# Patient Record
Sex: Female | Born: 1970 | Race: White | Hispanic: No | Marital: Single | Smoking: Current some day smoker
Health system: Southern US, Community
[De-identification: ages and names within clinical notes are randomized; demographics above are authoritative.]

## PROBLEM LIST (undated history)

## (undated) DIAGNOSIS — R112 Nausea with vomiting, unspecified: Secondary | ICD-10-CM

## (undated) DIAGNOSIS — Z9889 Other specified postprocedural states: Secondary | ICD-10-CM

## (undated) DIAGNOSIS — N201 Calculus of ureter: Secondary | ICD-10-CM

## (undated) DIAGNOSIS — R3915 Urgency of urination: Secondary | ICD-10-CM

## (undated) DIAGNOSIS — Z8719 Personal history of other diseases of the digestive system: Secondary | ICD-10-CM

## (undated) DIAGNOSIS — K219 Gastro-esophageal reflux disease without esophagitis: Secondary | ICD-10-CM

## (undated) DIAGNOSIS — R300 Dysuria: Secondary | ICD-10-CM

## (undated) DIAGNOSIS — Z87442 Personal history of urinary calculi: Secondary | ICD-10-CM

## (undated) DIAGNOSIS — R350 Frequency of micturition: Secondary | ICD-10-CM

## (undated) DIAGNOSIS — Z87448 Personal history of other diseases of urinary system: Secondary | ICD-10-CM

## (undated) HISTORY — PX: CYSTOSCOPY WITH URETHRAL DILATATION: SHX5125

---
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
## 1995-04-24 HISTORY — PX: INGUINAL HERNIA REPAIR: SUR1180

## 2000-06-24 ENCOUNTER — Other Ambulatory Visit: Admission: RE | Admit: 2000-06-24 | Discharge: 2000-06-24 | Payer: Self-pay | Admitting: Gynecology

## 2001-12-18 ENCOUNTER — Ambulatory Visit (HOSPITAL_COMMUNITY): Admission: RE | Admit: 2001-12-18 | Discharge: 2001-12-18 | Payer: Self-pay

## 2002-01-29 ENCOUNTER — Ambulatory Visit (HOSPITAL_COMMUNITY): Admission: RE | Admit: 2002-01-29 | Discharge: 2002-01-29 | Payer: Self-pay

## 2002-05-05 ENCOUNTER — Observation Stay (HOSPITAL_COMMUNITY): Admission: AD | Admit: 2002-05-05 | Discharge: 2002-05-06 | Payer: Self-pay

## 2002-06-03 ENCOUNTER — Encounter (HOSPITAL_COMMUNITY): Admission: RE | Admit: 2002-06-03 | Discharge: 2002-07-03 | Payer: Self-pay

## 2002-06-30 ENCOUNTER — Inpatient Hospital Stay (HOSPITAL_COMMUNITY): Admission: AD | Admit: 2002-06-30 | Discharge: 2002-06-30 | Payer: Self-pay

## 2002-07-02 ENCOUNTER — Inpatient Hospital Stay (HOSPITAL_COMMUNITY): Admission: AD | Admit: 2002-07-02 | Discharge: 2002-07-02 | Payer: Self-pay

## 2002-07-04 ENCOUNTER — Inpatient Hospital Stay (HOSPITAL_COMMUNITY): Admission: AD | Admit: 2002-07-04 | Discharge: 2002-07-06 | Payer: Self-pay

## 2004-01-03 ENCOUNTER — Inpatient Hospital Stay (HOSPITAL_COMMUNITY): Admission: AD | Admit: 2004-01-03 | Discharge: 2004-01-04 | Payer: Self-pay | Admitting: Obstetrics and Gynecology

## 2004-01-03 ENCOUNTER — Encounter (INDEPENDENT_AMBULATORY_CARE_PROVIDER_SITE_OTHER): Payer: Self-pay | Admitting: Specialist

## 2004-05-25 ENCOUNTER — Other Ambulatory Visit: Admission: RE | Admit: 2004-05-25 | Discharge: 2004-05-25 | Payer: Self-pay | Admitting: Obstetrics and Gynecology

## 2004-12-05 ENCOUNTER — Encounter (INDEPENDENT_AMBULATORY_CARE_PROVIDER_SITE_OTHER): Payer: Self-pay | Admitting: *Deleted

## 2004-12-05 ENCOUNTER — Inpatient Hospital Stay (HOSPITAL_COMMUNITY): Admission: RE | Admit: 2004-12-05 | Discharge: 2004-12-09 | Payer: Self-pay | Admitting: Obstetrics and Gynecology

## 2006-07-01 ENCOUNTER — Ambulatory Visit (HOSPITAL_COMMUNITY): Admission: RE | Admit: 2006-07-01 | Discharge: 2006-07-01 | Payer: Self-pay | Admitting: Obstetrics and Gynecology

## 2007-09-04 ENCOUNTER — Encounter: Admission: RE | Admit: 2007-09-04 | Discharge: 2007-09-04 | Payer: Self-pay | Admitting: Family Medicine

## 2008-06-29 ENCOUNTER — Encounter: Admission: RE | Admit: 2008-06-29 | Discharge: 2008-06-29 | Payer: Self-pay | Admitting: Family Medicine

## 2010-05-14 ENCOUNTER — Encounter: Payer: Self-pay | Admitting: Obstetrics and Gynecology

## 2010-09-08 NOTE — Discharge Summary (Signed)
Nicole Lawson, [place]               ACCOUNT NO.:  [id number]   MEDICAL RECORD NO.:  [id number]          PATIENT TYPE:  INP   LOCATION:  9133                          [facility]ILITY:  [facility]   PHYSICIAN:  Charles A. Delcambre, MDDATE OF BIRTH:  1970-08-05   DATE OF ADMISSION:  12/05/2004  DATE OF DISCHARGE:  12/09/2004                                 DISCHARGE SUMMARY   DISCHARGE DIAGNOSES:  1.  Previous cesarean section.  2.  Previous vaginal birth after cesarean section.  3.  Undesired fertility.  4.  Significant second trimester bleed with suspicious for significant      marginal separation of placenta, chronic, in my judgment,      contraindicating a trial of labor safety.   PROCEDURE:  1.  Repeat low transverse cesarean section.  2.  Bilateral tubal ligation.   HISTORY AND PHYSICAL:  Dictated on the chart.   DISPOSITION:  The patient is discharged home to follow up in the office in  48 hours.  Discontinue staples.  She was given convalescent instructions to  convalescent home to notify if any temperature greater than 100 degrees, no  lifting greater than 25 pounds for four weeks.  No driving for two weeks.  No bath for two weeks. Shower okay.  Call if temperature greater than 100  degrees, incisional drainage or erythema, increasing pain or bleeding.   DISCHARGE MEDICATIONS:  1.  Prescription for Percocet 5/325 one to two p.o. q.4h. p.r.n. #40.  2.  Hemacyte one p.o. daily #30.   She was discharged with instructions per Dr. Mia Creek in my absence.   LABORATORY DATA:  Postoperative hemoglobin 9.9, hematocrit 29.3.   HOSPITAL COURSE:  The patient was admitted and underwent surgery as noted  above.  She delivered a vigorous female, Apgars 9 and 9, 3500 grams, 51 cm.  She did well postoperatively.  Foley catheter was discontinued postoperative  day #1.  She was given general diet on postoperative day #1 and tolerated  the diet.  Pain was controlled with p.o. medications.  She,  therefore,  continued to do well.  On postoperative day #2, she was given  Diflucan for suspected thrush on the nipples with physical exam confirming.  She is RH negative.  The baby's test was pending at the time of discharge  and she would be given RhoGAM pending the outcome prior to discharge.  She  was discharged home with followup as noted above.      Charles A. Sydnee Cabal, MD  Electronically Signed     CAD/MEDQ  D:  01/06/2005  T:  01/07/2005  Job:  [id number]

## 2010-09-08 NOTE — Op Note (Signed)
NAMEALDENA, WORM               ACCOUNT NO.:  [id number]   MEDICAL RECORD NO.:  [id number]          PATIENT TYPE:  INP   LOCATION:  NA                            [facility]  [facility]   PHYSICIAN:  Charles A. Delcambre, MDDATE OF BIRTH:  Jul 29, 1970   DATE OF PROCEDURE:  12/05/2004  DATE OF DISCHARGE:                                 OPERATIVE REPORT   PREOPERATIVE DIAGNOSES:  1.  Intrauterine pregnancy at 39 weeks.  2.  Previous cesarean section.  3.  Second trimester bleeding.  4.  Vaginal birth after cesarean delivery x2.  5.  Undesired fertility.  6.  For repeat cesarean section with bilateral tubal ligation.   POSTOPERATIVE DIAGNOSES:  1.  Intrauterine pregnancy at 39 weeks.  2.  Previous cesarean section.  3.  Second trimester bleeding.  4.  Vaginal birth after cesarean delivery x2.  5.  Undesired fertility.  6.  For repeat cesarean section with bilateral tubal ligation.   PROCEDURES:  1.  Repeat low transverse cesarean section.  2.  Modified Pomeroy bilateral tubal ligation.   SURGEON:  Charles A. Sydnee Cabal, M.D.   ASSISTANT:  Rudy Jew. Ashley Royalty, M.D.   COMPLICATIONS:  None.   ESTIMATED BLOOD LOSS:  500 mL.   SPECIMENS:  Placenta to pathology.   OPERATIVE FINDINGS:  A vigorous female, Apgars 9 and 9.  Cord and arterial  blood gas pending.  Instrument, sponge and needle count correct x2.  Further  operative findings:  The tubes and uterus and ovaries were normal.   DESCRIPTION OF PROCEDURE:  The patient was taken to the operating room and  placed in the supine position.  Spinal had been dosed and was adequate.  Sterile prep and drape was undertaken.  A Pfannenstiel incision was made and  carried down to the fascia.  The fascia was incised and released superiorly  and inferiorly.  The rectus muscles were sharply dissected in the midline.  The peritoneum was entered with Metzenbaum scissors without damage to the  bowel, bladder and vascular structures.  Traction was  used to extend the  incision.  A bladder blade was placed.  The vesicouterine peritoneum was  incised with Metzenbaum scissors.  Blunt dissection was used to develop the  bladder flap, and a bladder flap was placed.  A lower uterine segment  transverse incision was made to amniotomy.  A hand was inserted.  The  occiput was lifted to the uterine incision site.  Fundal pressure was placed  by the operator's assistant and the infant was delivered without difficulty.  A nuchal cord x1 was reduced easily.  Cord was clamped.  The infant was  shown to the mother and father and handed off to the neonatologist in  attendance.  Cord gas was taken and cord blood sample was taken.  Placenta  was massaged free and manually expressed.  Internal surface of the uterus  was wiped with a moistened cloth.  The uterus was externalized for repair.  The uterus was closed with a single layer of #1 chromic running locking and  several figure-of-eight sutures of #1 chromic and  2-0 Vicryl were used to  achieve hemostasis.  Hemostasis was excellent.  Bladder flap hemostasis was  excellent.  A tubal ligation was carried out.  The middle segment of the  fallopian tube was ligated doubly and with 0 plain gut on either side  excised, hemostasis was excellent.  The segment was transected, and the  tubal lumen was clearly transected.  The uterus was then internalized and  the pedicles were visualized.  The incision on the uterus was closed, and  this had excellent hemostasis.  Irrigation was carried out.  Subfascial  hemostasis was excellent.  The fascia was closed with #1 Vicryl running  nonlocking suture.  Subcutaneous hemostasis was excellent.  Irrigation was  carried out.  Sterile skin staples were used to close the skin.  A sterile  dressing was applied.  The patient was taken to the recovery room with  physician in attendance.      Charles A. Sydnee Cabal, MD  Electronically Signed     CAD/MEDQ  D:  12/05/2004   T:  12/05/2004  Job:  [id number]
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
## 2012-02-06 ENCOUNTER — Other Ambulatory Visit: Payer: Self-pay | Admitting: Family Medicine

## 2012-02-06 DIAGNOSIS — Z1231 Encounter for screening mammogram for malignant neoplasm of breast: Secondary | ICD-10-CM

## 2012-03-10 ENCOUNTER — Ambulatory Visit: Payer: Self-pay

## 2015-04-17 ENCOUNTER — Encounter (HOSPITAL_COMMUNITY): Payer: Self-pay | Admitting: Emergency Medicine

## 2015-04-17 ENCOUNTER — Emergency Department (HOSPITAL_COMMUNITY): Payer: BLUE CROSS/BLUE SHIELD

## 2015-04-17 ENCOUNTER — Emergency Department (HOSPITAL_COMMUNITY)
Admission: EM | Admit: 2015-04-17 | Discharge: 2015-04-17 | Disposition: A | Discharge status: Home / Self Care | Payer: BLUE CROSS/BLUE SHIELD | Attending: Emergency Medicine | Admitting: Emergency Medicine

## 2015-04-17 DIAGNOSIS — Z9889 Other specified postprocedural states: Secondary | ICD-10-CM | POA: Diagnosis not present

## 2015-04-17 DIAGNOSIS — N23 Unspecified renal colic: Secondary | ICD-10-CM

## 2015-04-17 DIAGNOSIS — R109 Unspecified abdominal pain: Secondary | ICD-10-CM | POA: Diagnosis present

## 2015-04-17 DIAGNOSIS — Z87442 Personal history of urinary calculi: Secondary | ICD-10-CM | POA: Insufficient documentation

## 2015-04-17 DIAGNOSIS — Z3202 Encounter for pregnancy test, result negative: Secondary | ICD-10-CM | POA: Diagnosis not present

## 2015-04-17 LAB — URINALYSIS, ROUTINE W REFLEX MICROSCOPIC
GLUCOSE, UA: NEGATIVE mg/dL
KETONES UR: 15 mg/dL — AB
Nitrite: NEGATIVE
PROTEIN: 100 mg/dL — AB
Specific Gravity, Urine: 1.022 (ref 1.005–1.030)
pH: 6.5 (ref 5.0–8.0)

## 2015-04-17 LAB — I-STAT CHEM 8, ED
BUN: 9 mg/dL (ref 6–20)
CALCIUM ION: 1.12 mmol/L (ref 1.12–1.23)
Chloride: 108 mmol/L (ref 101–111)
Creatinine, Ser: 0.8 mg/dL (ref 0.44–1.00)
GLUCOSE: 111 mg/dL — AB (ref 65–99)
HCT: 41 % (ref 36.0–46.0)
HEMOGLOBIN: 13.9 g/dL (ref 12.0–15.0)
Potassium: 4 mmol/L (ref 3.5–5.1)
Sodium: 145 mmol/L (ref 135–145)
TCO2: 23 mmol/L (ref 0–100)

## 2015-04-17 LAB — URINE MICROSCOPIC-ADD ON

## 2015-04-17 LAB — I-STAT BETA HCG BLOOD, ED (MC, WL, AP ONLY)

## 2015-04-17 MED ORDER — HYDROMORPHONE HCL 1 MG/ML IJ SOLN
1.0000 mg | Freq: Once | INTRAMUSCULAR | Status: AC
  Administered 2015-04-17: 1 mg via INTRAVENOUS
  Filled 2015-04-17: qty 1

## 2015-04-17 MED ORDER — ONDANSETRON HCL 4 MG/2ML IJ SOLN
4.0000 mg | Freq: Once | INTRAMUSCULAR | Status: AC
Start: 2015-04-17 — End: 2015-04-17
  Administered 2015-04-17: 4 mg via INTRAVENOUS
  Filled 2015-04-17: qty 2

## 2015-04-17 MED ORDER — HYDROCODONE-ACETAMINOPHEN 5-325 MG PO TABS
1.0000 | ORAL_TABLET | ORAL | Status: AC | PRN
Start: 2015-04-17 — End: ?

## 2015-04-17 MED ORDER — KETOROLAC TROMETHAMINE 30 MG/ML IJ SOLN
30.0000 mg | Freq: Once | INTRAMUSCULAR | Status: AC
  Administered 2015-04-17: 30 mg via INTRAVENOUS
  Filled 2015-04-17: qty 1

## 2015-04-17 MED ORDER — ONDANSETRON HCL 8 MG PO TABS
8.0000 mg | ORAL_TABLET | Freq: Three times a day (TID) | ORAL | Status: AC | PRN
Start: 2015-04-17 — End: ?

## 2015-04-17 MED ORDER — METOCLOPRAMIDE HCL 5 MG/ML IJ SOLN
10.0000 mg | Freq: Once | INTRAMUSCULAR | Status: AC
  Administered 2015-04-17: 10 mg via INTRAVENOUS
  Filled 2015-04-17: qty 2

## 2015-04-17 NOTE — ED Notes (Signed)
Patient states she started having left sided flank plain radiating to the groin. Patient states has HX of kidney stones last one a few years ago. Patient states pain fells the same. Patient states was able to past pervious on her own. Patient rates pain 8/10

## 2015-04-17 NOTE — ED Notes (Signed)
Patient ambulated to restroom to give urine sample patient unable to urinate. Patient advised in & Out patient refused.

## 2015-04-17 NOTE — ED Provider Notes (Addendum)
CSN: [id number]     Arrival date & time 04/17/15  0726 History   First MD Initiated Contact with Patient 04/17/15 [phone]     Chief Complaint  Patient presents with  . Flank Pain     (Consider location/radiation/quality/duration/timing/severity/associated sxs/prior Treatment) HPI Planes of left-sided groin pain radiating to left flank onto 3 PM yesterday accompanied by vomiting every 30 minutes and nausea. She treated herself by drinking water. Nothing makes pain better or worse pain feels like kidney stone she's had in the past. Associated symptoms including hematuria. She denies fever. No other associated symptoms. Pain is moderate to severe at present 7 on a scale of 1-10. Last menstrual period 04/10/2015 No past medical history on file. past medical history kidney stones No past surgical history on file. surgical history unknown abdominal surgery as a teenager No family history on file. Social History  Substance Use Topics  . Smoking status: Not on file  . Smokeless tobacco: Not on file  . Alcohol Use: Not on file   positive smoker occasional alcohol no illicit drug use OB History    No data available     Review of Systems  Constitutional: Negative.   HENT: Negative.   Respiratory: Negative.   Cardiovascular: Negative.   Gastrointestinal: Positive for nausea and vomiting.  Genitourinary: Positive for flank pain.  Musculoskeletal: Negative.   Skin: Negative.   Neurological: Negative.   Psychiatric/Behavioral: Negative.   All other systems reviewed and are negative.     Allergies  Review of patient's allergies indicates not on file.  Home Medications   Prior to Admission medications   Not on File   BP 102/68 mmHg  Pulse 70  Temp(Src) 98.3 F (36.8 C) (Oral)  Resp 16  SpO2 99% Physical Exam  Constitutional: She appears well-developed and well-nourished. She appears distressed.  Appears uncomfortable  HENT:  Head: Normocephalic and atraumatic.  Eyes:  Conjunctivae are normal. Pupils are equal, round, and reactive to light.  Neck: Neck supple. No tracheal deviation present. No thyromegaly present.  Cardiovascular: Normal rate and regular rhythm.   No murmur heard. Pulmonary/Chest: Effort normal and breath sounds normal.  Abdominal: Soft. Bowel sounds are normal. She exhibits no distension. There is no tenderness.  Genitourinary:  No flank tenderness  Musculoskeletal: Normal range of motion. She exhibits no edema or tenderness.  Neurological: She is alert. Coordination normal.  Skin: Skin is warm and dry. No rash noted.  Psychiatric: She has a normal mood and affect.  Nursing note and vitals reviewed.   ED Course  Procedures (including critical care time) Labs Review Labs Reviewed - No data to display  Imaging Review No results found. I have personally reviewed and evaluated these images and lab results as part of my medical decision-making.   EKG Interpretation None     3:10 PM patient asymptomatic, pain-free, feels ready to go home after treatment with intravenous opioids, antiemetics and intravenous Toradol Results for orders placed or performed during the hospital encounter of 04/17/15  Urinalysis, Routine w reflex microscopic (not [facility])  Result Value Ref Range   Color, Urine RED (A) YELLOW   APPearance CLOUDY (A) CLEAR   Specific Gravity, Urine 1.022 1.005 - 1.030   pH 6.5 5.0 - 8.0   Glucose, UA NEGATIVE NEGATIVE mg/dL   Hgb urine dipstick LARGE (A) NEGATIVE   Bilirubin Urine SMALL (A) NEGATIVE   Ketones, ur 15 (A) NEGATIVE mg/dL   Protein, ur 295100 (A) NEGATIVE mg/dL   Nitrite NEGATIVE  NEGATIVE   Leukocytes, UA SMALL (A) NEGATIVE  Urine microscopic-add on  Result Value Ref Range   Squamous Epithelial / LPF 0-5 (A) NONE SEEN   WBC, UA 0-5 0 - 5 WBC/hpf   RBC / HPF TOO NUMEROUS TO COUNT 0 - 5 RBC/hpf   Bacteria, UA RARE (A) NONE SEEN  I-stat chem 8, ed  Result Value Ref Range   Sodium 145 135 - 145 mmol/L    Potassium 4.0 3.5 - 5.1 mmol/L   Chloride 108 101 - 111 mmol/L   BUN 9 6 - 20 mg/dL   Creatinine, Ser 0.98 0.44 - 1.00 mg/dL   Glucose, Bld 119 (H) 65 - 99 mg/dL   Calcium, Ion 1.47 8.29 - 1.23 mmol/L   TCO2 23 0 - 100 mmol/L   Hemoglobin 13.9 12.0 - 15.0 g/dL   HCT 56.2 13.0 - 86.5 %  I-Stat Beta hCG blood, ED ([facility], [facility], [facility] only)  Result Value Ref Range   I-stat hCG, quantitative <5.0 <5 mIU/mL   Comment 3           Ct Abdomen Pelvis Wo Contrast  04/17/2015  CLINICAL DATA:  Left flank pain since yesterday EXAM: CT ABDOMEN AND PELVIS WITHOUT CONTRAST TECHNIQUE: Multidetector CT imaging of the abdomen and pelvis was performed following the standard protocol without IV contrast. COMPARISON:  June 29, 2008 FINDINGS: There is left hydroureteronephrosis due to obstruction by a 5.4 mm stone in the distal left ureter. There is a 3 mm nonobstructing stone in midpole left kidney. The right kidney is normal without hydronephrosis or nephrolithiasis. The liver, spleen, pancreas, gallbladder, adrenal glands are normal. The aorta is normal. There is no abdominal lymphadenopathy. There is no small bowel obstruction or diverticulitis. The appendix is normal. The bladder is normal. The uterus is normal. There are small cysts in normal size bilateral ovaries. The lung bases are clear. No acute abnormality is identified in the bones. IMPRESSION: Left hydroureteronephrosis due to obstruction by 5.4 mm stone in the distal left ureter. Electronically Signed   By: Sherian Rein M.D.   On: 04/17/2015 11:20    MDM  Plan ibuprofen for pain. Prescriptions for Norco, zofran Referral [facility] Diagnosis ureteral colic Final diagnoses:  None        Doug Sou, MD 04/17/15 1515  Doug Sou, MD 04/17/15 1517

## 2015-04-17 NOTE — Discharge Instructions (Signed)
Kidney Stones Take Advil 4 tablets (800milligrams) 3 times daily for moderate or mild pain or take the pain medicine prescribed for bad pain. You've also been prescribed medicine for nausea. Call [facility] tomorrow to schedule the next available appointment. If pain or nausea is not well controlled with the medication prescribed, [facility]. Kidney stones (urolithiasis) are solid masses that form inside your kidneys. The intense pain is caused by the stone moving through the kidney, ureter, bladder, and urethra (urinary tract). When the stone moves, the ureter starts to spasm around the stone. The stone is usually passed in your pee (urine).  HOME CARE  Drink enough fluids to keep your pee clear or pale yellow. This helps to get the stone out.  Take a 24-hour pee (urine) sample as told by your doctor. You may need to take another sample every 6-12 months.  Strain all pee through the provided strainer. Do not pee without peeing through the strainer, not even once. If you pee the stone out, catch it in the strainer. The stone may be as small as a grain of salt. Take this to your doctor. This will help your doctor figure out what you can do to try to prevent more kidney stones.  Only take medicine as told by your doctor.  Make changes to your daily diet as told by your doctor. You may be told to:  Limit how much salt you eat.  Eat 5 or more servings of fruits and vegetables each day.  Limit how much meat, poultry, fish, and eggs you eat.  Keep all follow-up visits as told by your doctor. This is important.  Get follow-up X-rays as told by your doctor. GET HELP IF: You have pain that gets worse even if you have been taking pain medicine. GET HELP RIGHT AWAY IF:   Your pain does not get better with medicine.  You have a fever or shaking chills.  Your pain increases and gets worse over 18 hours.  You have new belly (abdominal)  pain.  You feel faint or pass out.  You are unable to pee.   This information is not intended to replace advice given to you by your health care provider. Make sure you discuss any questions you have with your health care provider.   Document Released: 09/26/2007 Document Revised: 12/29/2014 Document Reviewed: 09/10/2012 Elsevier Interactive Patient Education [employer] Inc.

## 2015-04-28 ENCOUNTER — Encounter (HOSPITAL_BASED_OUTPATIENT_CLINIC_OR_DEPARTMENT_OTHER): Payer: Self-pay | Admitting: *Deleted

## 2015-04-28 ENCOUNTER — Other Ambulatory Visit: Payer: Self-pay | Admitting: Urology

## 2015-04-29 ENCOUNTER — Encounter (HOSPITAL_BASED_OUTPATIENT_CLINIC_OR_DEPARTMENT_OTHER): Payer: Self-pay | Admitting: *Deleted

## 2015-04-29 NOTE — Progress Notes (Signed)
NPO AFTER MN.  ARRIVE AT 0745.  NEEDS HG.  MAY TAKE PAIN/ NAUSEA RX'S IF NEEDED TAKE AM DOS W/ SIPS OF WATER.

## 2015-05-04 ENCOUNTER — Ambulatory Visit (HOSPITAL_BASED_OUTPATIENT_CLINIC_OR_DEPARTMENT_OTHER): Payer: BLUE CROSS/BLUE SHIELD | Admitting: Anesthesiology

## 2015-05-04 ENCOUNTER — Encounter (HOSPITAL_BASED_OUTPATIENT_CLINIC_OR_DEPARTMENT_OTHER)
Admission: RE | Disposition: A | Discharge status: Home / Self Care | Payer: Self-pay | Source: Ambulatory Visit | Attending: Urology

## 2015-05-04 ENCOUNTER — Ambulatory Visit (HOSPITAL_BASED_OUTPATIENT_CLINIC_OR_DEPARTMENT_OTHER)
Admission: RE | Admit: 2015-05-04 | Discharge: 2015-05-04 | Disposition: A | Discharge status: Home / Self Care | Payer: BLUE CROSS/BLUE SHIELD | Source: Ambulatory Visit | Attending: Urology | Admitting: Urology

## 2015-05-04 ENCOUNTER — Encounter (HOSPITAL_BASED_OUTPATIENT_CLINIC_OR_DEPARTMENT_OTHER): Payer: Self-pay | Admitting: *Deleted

## 2015-05-04 DIAGNOSIS — N132 Hydronephrosis with renal and ureteral calculous obstruction: Secondary | ICD-10-CM | POA: Insufficient documentation

## 2015-05-04 DIAGNOSIS — F17200 Nicotine dependence, unspecified, uncomplicated: Secondary | ICD-10-CM | POA: Diagnosis not present

## 2015-05-04 DIAGNOSIS — K5900 Constipation, unspecified: Secondary | ICD-10-CM | POA: Insufficient documentation

## 2015-05-04 DIAGNOSIS — K219 Gastro-esophageal reflux disease without esophagitis: Secondary | ICD-10-CM | POA: Diagnosis not present

## 2015-05-04 DIAGNOSIS — N201 Calculus of ureter: Secondary | ICD-10-CM

## 2015-05-04 HISTORY — DX: Other specified postprocedural states: Z98.890

## 2015-05-04 HISTORY — DX: Personal history of other diseases of urinary system: Z87.448

## 2015-05-04 HISTORY — DX: Dysuria: R30.0

## 2015-05-04 HISTORY — DX: Frequency of micturition: R35.0

## 2015-05-04 HISTORY — PX: STONE EXTRACTION WITH BASKET: SHX5318

## 2015-05-04 HISTORY — DX: Nausea with vomiting, unspecified: R11.2

## 2015-05-04 HISTORY — DX: Gastro-esophageal reflux disease without esophagitis: K21.9

## 2015-05-04 HISTORY — PX: CYSTOSCOPY/URETEROSCOPY/HOLMIUM LASER: SHX6545

## 2015-05-04 HISTORY — PX: HOLMIUM LASER APPLICATION: SHX5852

## 2015-05-04 HISTORY — PX: CYSTOSCOPY W/ RETROGRADES: SHX1426

## 2015-05-04 HISTORY — DX: Urgency of urination: R39.15

## 2015-05-04 HISTORY — DX: Personal history of other diseases of the digestive system: Z87.19

## 2015-05-04 HISTORY — DX: Calculus of ureter: N20.1

## 2015-05-04 HISTORY — DX: Personal history of urinary calculi: Z87.442

## 2015-05-04 LAB — POCT HEMOGLOBIN-HEMACUE: HEMOGLOBIN: 12.4 g/dL (ref 12.0–15.0)

## 2015-05-04 SURGERY — HOLMIUM LASER APPLICATION
Anesthesia: General | Site: Ureter | Laterality: Left

## 2015-05-04 MED ORDER — LIDOCAINE HCL (CARDIAC) 20 MG/ML IV SOLN
INTRAVENOUS | Status: AC
  Filled 2015-05-04: qty 5

## 2015-05-04 MED ORDER — DEXAMETHASONE SODIUM PHOSPHATE 4 MG/ML IJ SOLN
INTRAMUSCULAR | Status: DC | PRN
  Administered 2015-05-04: 10 mg via INTRAVENOUS

## 2015-05-04 MED ORDER — LIDOCAINE HCL (CARDIAC) 20 MG/ML IV SOLN
INTRAVENOUS | Status: DC | PRN
  Administered 2015-05-04: 60 mg via INTRAVENOUS

## 2015-05-04 MED ORDER — PROPOFOL 10 MG/ML IV BOLUS
INTRAVENOUS | Status: AC
  Filled 2015-05-04: qty 20

## 2015-05-04 MED ORDER — KETOROLAC TROMETHAMINE 30 MG/ML IJ SOLN
INTRAMUSCULAR | Status: DC | PRN
  Administered 2015-05-04: 30 mg via INTRAVENOUS

## 2015-05-04 MED ORDER — MIDAZOLAM HCL 2 MG/2ML IJ SOLN
INTRAMUSCULAR | Status: AC
  Filled 2015-05-04: qty 2

## 2015-05-04 MED ORDER — FENTANYL CITRATE (PF) 100 MCG/2ML IJ SOLN
25.0000 ug | INTRAMUSCULAR | Status: DC | PRN
  Filled 2015-05-04: qty 1

## 2015-05-04 MED ORDER — LACTATED RINGERS IV SOLN
INTRAVENOUS | Status: DC
  Administered 2015-05-04: 09:00:00 via INTRAVENOUS
  Filled 2015-05-04: qty 1000

## 2015-05-04 MED ORDER — FENTANYL CITRATE (PF) 100 MCG/2ML IJ SOLN
INTRAMUSCULAR | Status: AC
  Filled 2015-05-04: qty 2

## 2015-05-04 MED ORDER — SCOPOLAMINE 1 MG/3DAYS TD PT72
1.0000 | MEDICATED_PATCH | TRANSDERMAL | Status: DC
  Administered 2015-05-04: 1.5 mg via TRANSDERMAL
  Filled 2015-05-04: qty 1

## 2015-05-04 MED ORDER — ONDANSETRON HCL 4 MG/2ML IJ SOLN
INTRAMUSCULAR | Status: AC
  Filled 2015-05-04: qty 2

## 2015-05-04 MED ORDER — EPHEDRINE SULFATE 50 MG/ML IJ SOLN
INTRAMUSCULAR | Status: AC
  Filled 2015-05-04: qty 1

## 2015-05-04 MED ORDER — SUCCINYLCHOLINE CHLORIDE 20 MG/ML IJ SOLN
INTRAMUSCULAR | Status: DC | PRN
  Administered 2015-05-04: 100 mg via INTRAVENOUS

## 2015-05-04 MED ORDER — FENTANYL CITRATE (PF) 100 MCG/2ML IJ SOLN
INTRAMUSCULAR | Status: DC | PRN
  Administered 2015-05-04 (×2): 50 ug via INTRAVENOUS

## 2015-05-04 MED ORDER — SCOPOLAMINE 1 MG/3DAYS TD PT72
MEDICATED_PATCH | TRANSDERMAL | Status: AC
  Filled 2015-05-04: qty 1

## 2015-05-04 MED ORDER — SODIUM CHLORIDE 0.9 % IR SOLN
Status: DC | PRN
  Administered 2015-05-04: 2000 mL

## 2015-05-04 MED ORDER — ONDANSETRON HCL 4 MG/2ML IJ SOLN
INTRAMUSCULAR | Status: DC | PRN
  Administered 2015-05-04: 4 mg via INTRAVENOUS

## 2015-05-04 MED ORDER — PROPOFOL 10 MG/ML IV BOLUS
INTRAVENOUS | Status: DC | PRN
  Administered 2015-05-04: 50 mg via INTRAVENOUS
  Administered 2015-05-04: 200 mg via INTRAVENOUS

## 2015-05-04 MED ORDER — DEXAMETHASONE SODIUM PHOSPHATE 10 MG/ML IJ SOLN
INTRAMUSCULAR | Status: AC
  Filled 2015-05-04: qty 1

## 2015-05-04 MED ORDER — PROMETHAZINE HCL 25 MG/ML IJ SOLN
6.2500 mg | INTRAMUSCULAR | Status: DC | PRN
  Filled 2015-05-04: qty 1

## 2015-05-04 MED ORDER — PROPOFOL 500 MG/50ML IV EMUL: INTRAVENOUS | Status: DC | PRN

## 2015-05-04 MED ORDER — LIDOCAINE HCL 2 % EX GEL
CUTANEOUS | Status: DC | PRN
  Administered 2015-05-04: 1

## 2015-05-04 MED ORDER — KETOROLAC TROMETHAMINE 30 MG/ML IJ SOLN
INTRAMUSCULAR | Status: AC
  Filled 2015-05-04: qty 1

## 2015-05-04 MED ORDER — HYDROCODONE-ACETAMINOPHEN 5-325 MG PO TABS: 1.0000 | ORAL_TABLET | Freq: Four times a day (QID) | ORAL | Status: AC | PRN

## 2015-05-04 MED ORDER — PROPOFOL 500 MG/50ML IV EMUL
INTRAVENOUS | Status: DC | PRN
  Administered 2015-05-04: 150 ug/kg/min via INTRAVENOUS

## 2015-05-04 MED ORDER — CIPROFLOXACIN IN D5W 400 MG/200ML IV SOLN
400.0000 mg | INTRAVENOUS | Status: AC
  Administered 2015-05-04: 400 mg via INTRAVENOUS
  Filled 2015-05-04: qty 200

## 2015-05-04 MED ORDER — LIDOCAINE HCL 4 % EX SOLN
CUTANEOUS | Status: DC | PRN
  Administered 2015-05-04: 2 mL via TOPICAL

## 2015-05-04 MED ORDER — MIDAZOLAM HCL 5 MG/5ML IJ SOLN
INTRAMUSCULAR | Status: DC | PRN
  Administered 2015-05-04: 2 mg via INTRAVENOUS

## 2015-05-04 MED ORDER — EPHEDRINE SULFATE 50 MG/ML IJ SOLN
INTRAMUSCULAR | Status: DC | PRN
  Administered 2015-05-04: 15 mg via INTRAVENOUS

## 2015-05-04 MED ORDER — IOHEXOL 350 MG/ML SOLN
INTRAVENOUS | Status: DC | PRN
  Administered 2015-05-04: 3 mL

## 2015-05-04 MED ORDER — PROPOFOL 500 MG/50ML IV EMUL
INTRAVENOUS | Status: AC
  Filled 2015-05-04: qty 50

## 2015-05-04 MED ORDER — CIPROFLOXACIN IN D5W 400 MG/200ML IV SOLN
INTRAVENOUS | Status: AC
  Filled 2015-05-04: qty 200

## 2015-05-04 SURGICAL SUPPLY — 44 items
ADAPTER CATH URET PLST 4-6FR (CATHETERS) IMPLANT
APL SKNCLS STERI-STRIP NONHPOA (GAUZE/BANDAGES/DRESSINGS)
BAG DRAIN URO-CYSTO SKYTR STRL (DRAIN) ×5 IMPLANT
BASKET LASER NITINOL 1.9FR (BASKET) IMPLANT
BASKET STNLS GEMINI 4WIRE 3FR (BASKET) IMPLANT
BASKET ZERO TIP NITINOL 2.4FR (BASKET) ×5 IMPLANT
BENZOIN TINCTURE PRP APPL 2/3 (GAUZE/BANDAGES/DRESSINGS) IMPLANT
BSKT STON RTRVL 120 1.9FR (BASKET)
BSKT STON RTRVL GEM 120X11 3FR (BASKET)
CANISTER SUCT LVC 12 LTR MEDI- (MISCELLANEOUS) IMPLANT
CATH INTERMIT  6FR 70CM (CATHETERS) ×5 IMPLANT
CATH URET 5FR 28IN CONE TIP (BALLOONS)
CATH URET 5FR 28IN OPEN ENDED (CATHETERS) IMPLANT
CATH URET 5FR 70CM CONE TIP (BALLOONS) IMPLANT
CLOTH BEACON ORANGE TIMEOUT ST (SAFETY) ×5 IMPLANT
DRSG TEGADERM 2-3/8X2-3/4 SM (GAUZE/BANDAGES/DRESSINGS) IMPLANT
FIBER LASER FLEXIVA 1000 (UROLOGICAL SUPPLIES) IMPLANT
FIBER LASER FLEXIVA 365 (UROLOGICAL SUPPLIES) ×5 IMPLANT
FIBER LASER FLEXIVA 550 (UROLOGICAL SUPPLIES) IMPLANT
FIBER LASER TRAC TIP (UROLOGICAL SUPPLIES) IMPLANT
GLOVE BIO SURGEON STRL SZ 6.5 (GLOVE) ×4 IMPLANT
GLOVE BIO SURGEON STRL SZ7.5 (GLOVE) ×5 IMPLANT
GLOVE BIO SURGEONS STRL SZ 6.5 (GLOVE) ×1
GLOVE BIOGEL PI IND STRL 6.5 (GLOVE) ×6 IMPLANT
GLOVE BIOGEL PI INDICATOR 6.5 (GLOVE) ×4
GOWN STRL REUS W/ TWL XL LVL3 (GOWN DISPOSABLE) ×3 IMPLANT
GOWN STRL REUS W/TWL XL LVL3 (GOWN DISPOSABLE) ×2
GUIDEWIRE 0.038 PTFE COATED (WIRE) IMPLANT
GUIDEWIRE ANG ZIPWIRE 038X150 (WIRE) IMPLANT
GUIDEWIRE STR DUAL SENSOR (WIRE) ×5 IMPLANT
IV NS 1000ML (IV SOLUTION) ×6
IV NS 1000ML BAXH (IV SOLUTION) ×6 IMPLANT
IV NS IRRIG 3000ML ARTHROMATIC (IV SOLUTION) IMPLANT
KIT BALLIN UROMAX 15FX10 (LABEL) IMPLANT
KIT BALLN UROMAX 15FX4 (MISCELLANEOUS) IMPLANT
KIT BALLN UROMAX 26 75X4 (MISCELLANEOUS)
KIT ROOM TURNOVER WOR (KITS) ×5 IMPLANT
MANIFOLD NEPTUNE II (INSTRUMENTS) ×5 IMPLANT
NS IRRIG 500ML POUR BTL (IV SOLUTION) IMPLANT
PACK CYSTO (CUSTOM PROCEDURE TRAY) ×5 IMPLANT
SET HIGH PRES BAL DIL (LABEL)
SHEATH ACCESS URETERAL 38CM (SHEATH) IMPLANT
TUBE CONNECTING 12'X1/4 (SUCTIONS)
TUBE CONNECTING 12X1/4 (SUCTIONS) IMPLANT

## 2015-05-04 NOTE — Anesthesia Postprocedure Evaluation (Signed)
Anesthesia Post Note  Patient: Emmagrace Call  Procedure(s) Performed: Procedure(s) (LRB): HOLMIUM LASER APPLICATION (Left) CYSTOSCOPY WITH RETROGRADE PYELOGRAM (Left) CYSTOSCOPY/URETEROSCOPY/HOLMIUM LASER (Left) STONE EXTRACTION WITH BASKET (Left)  Patient location during evaluation: PACU Anesthesia Type: General Level of consciousness: awake and alert, awake and oriented Pain management: pain level controlled Vital Signs Assessment: post-procedure vital signs reviewed and stable Respiratory status: spontaneous breathing, nonlabored ventilation, respiratory function stable and patient connected to nasal cannula oxygen Cardiovascular status: blood pressure returned to baseline and stable Postop Assessment: no signs of nausea or vomiting Anesthetic complications: no    Last Vitals:  Filed Vitals:   05/04/15 1045 05/04/15 1100  BP: 114/83 124/83  Pulse: 86 78  Temp:    Resp: 16 15    Last Pain: There were no vitals filed for this visit.               Cecile HearingStephen Edward Yazen Rosko

## 2015-05-04 NOTE — Anesthesia Preprocedure Evaluation (Signed)
Anesthesia Evaluation  Patient identified by MRN, date of birth, ID band Patient awake    Reviewed: Allergy & Precautions, NPO status , Patient's Chart, lab work & pertinent test results  History of Anesthesia Complications (+) PONV and history of anesthetic complications  Airway Mallampati: II  TM Distance: <3 FB Neck ROM: Full    Dental  (+) Teeth Intact, Dental Advisory Given   Pulmonary Current Smoker,    Pulmonary exam normal breath sounds clear to auscultation       Cardiovascular Exercise Tolerance: Good (-) hypertensionnegative cardio ROS Normal cardiovascular exam Rhythm:Regular Rate:Normal     Neuro/Psych negative neurological ROS  negative psych ROS   GI/Hepatic Neg liver ROS, GERD (~2 times/week. Last episode last evening)  ,  Endo/Other  negative endocrine ROS  Renal/GU nephrolithiasis     Musculoskeletal negative musculoskeletal ROS (+)   Abdominal   Peds  Hematology negative hematology ROS (+)   Anesthesia Other Findings Day of surgery medications reviewed with the patient.  Reproductive/Obstetrics negative OB ROS                             Anesthesia Physical Anesthesia Plan  ASA: II  Anesthesia Plan: General   Post-op Pain Management:    Induction: Intravenous  Airway Management Planned: Oral ETT  Additional Equipment:   Intra-op Plan:   Post-operative Plan: Extubation in OR  Informed Consent: I have reviewed the patients History and Physical, chart, labs and discussed the procedure including the risks, benefits and alternatives for the proposed anesthesia with the patient or authorized representative who has indicated his/her understanding and acceptance.   Dental advisory given  Plan Discussed with: CRNA  Anesthesia Plan Comments: (Risks/benefits of general anesthesia discussed with patient including risk of damage to teeth, lips, gum, and tongue,  nausea/vomiting, allergic reactions to medications, and the possibility of heart attack, stroke and death.  All patient questions answered.  Patient wishes to proceed.)        Anesthesia Quick Evaluation

## 2015-05-04 NOTE — Anesthesia Procedure Notes (Signed)
Procedure Name: Intubation Date/Time: 05/04/2015 9:38 AM Performed by: Mechele Claude Pre-anesthesia Checklist: Patient identified, Emergency Drugs available, Suction available and Patient being monitored Patient Re-evaluated:Patient Re-evaluated prior to inductionOxygen Delivery Method: Circle System Utilized Preoxygenation: Pre-oxygenation with 100% oxygen Intubation Type: IV induction Ventilation: Mask ventilation without difficulty Laryngoscope Size: Mac and 3 Grade View: Grade I Tube type: Oral Tube size: 7.0 mm Number of attempts: 1 Airway Equipment and Method: Stylet and LTA kit utilized Placement Confirmation: ETT inserted through vocal cords under direct vision,  positive ETCO2 and breath sounds checked- equal and bilateral Secured at: 20 cm Tube secured with: Tape Dental Injury: Teeth and Oropharynx as per pre-operative assessment

## 2015-05-04 NOTE — Transfer of Care (Signed)
Last Vitals:  Filed Vitals:   05/04/15 0749  BP: 104/73  Pulse: 64  Temp: 36.8 C  Resp: 16    Immediate Anesthesia Transfer of Care Note  Patient: Nicole Lawson  Procedure(s) Performed: Procedure(s) (LRB): HOLMIUM LASER APPLICATION (Left) CYSTOSCOPY WITH RETROGRADE PYELOGRAM (Left) CYSTOSCOPY/URETEROSCOPY/HOLMIUM LASER (Left) STONE EXTRACTION WITH BASKET (Left)  Patient Location: PACU  Anesthesia Type: General  Level of Consciousness: awake, alert  and oriented  Airway & Oxygen Therapy: Patient Spontanous Breathing and Patient connected to nasal cannula oxygen  Post-op Assessment: Report given to PACU RN and Post -op Vital signs reviewed and stable  Post vital signs: Reviewed and stable  Complications: No apparent anesthesia complications

## 2015-05-04 NOTE — Op Note (Signed)
Preoperative diagnosis: left ureteral calculus  Postoperative diagnosis: left ureteral calculus  Procedure:  1. Cystoscopy 2. left ureteroscopy and stone removal 3. Ureteroscopic laser lithotripsy 4. left retrograde pyelography with interpretation  Surgeon: Valetta Fulleravid S. Oriya Kettering, MD  Anesthesia: General  Complications: None  Intraoperative findings: left retrograde pyelography demonstrated a filling defect within the left ureter consistent with the patient's known calculus without other abnormalities.  EBL: Minimal  Specimens: 1. left ureteral calculus  Disposition of specimens: [facility] for stone analysis  Indication: Nicole Lawson is a 45 y.o.   patient with urolithiasis. After reviewing the management options for treatment, the patient elected to proceed with the above surgical procedure(s). We have discussed the potential benefits and risks of the procedure, side effects of the proposed treatment, the likelihood of the patient achieving the goals of the procedure, and any potential problems that might occur during the procedure or recuperation. Informed consent has been obtained.  Description of procedure:  The patient was taken to the operating room and general anesthesia was induced.  The patient was placed in the dorsal lithotomy position, prepped and draped in the usual sterile fashion, and preoperative antibiotics were administered. A preoperative time-out was performed.   Cystourethroscopy was performed.  The patient's urethra was examined and was normal. The bladder was then systematically examined in its entirety. There was no evidence for any bladder tumors, stones, or other mucosal pathology.    Attention then turned to the left ureteral orifice and a ureteral catheter was used to intubate the ureteral orifice.  Omnipaque contrast was injected through the ureteral catheter and a retrograde pyelogram was performed with findings as dictated  above.  A 0.38 sensor guidewire was then advanced up the left ureter into the renal pelvis under fluoroscopic guidance. The 6 Fr semirigid ureteroscope was then advanced into the ureter next to the guidewire and the calculus was identified.   The stone was then fragmented with the 365 micron holmium laser fiber on a setting of 0.8J and frequency of 5 Hz.   All stones were then removed from the ureter with a zero tip nitinol basket.  Reinspection of the ureter revealed no remaining visible stones or fragments.   The wire was then removed.  The bladder was then emptied and the procedure ended.  The patient appeared to tolerate the procedure well and without complications.  The patient was able to be awakened and transferred to the recovery unit in satisfactory condition.

## 2015-05-04 NOTE — H&P (Signed)
Active Problems Problems  1. Calculus of distal left ureter (N20.1)   Assessed By: Jetta Lout (Urology); Last Assessed: 27 Apr 2015  History of Present Illness 45 YO female patient of Dr. Ellin Goodie seen today for a 1 week f/u.     GU hx:  04/19/15 referral to the emergency room for a newly diagnosed left distal ureteral stone as well as some nephrolithiasis. She had a relatively uneventful stone event 4 to 5 years ago. She does have a family history of nephrolithiasis. On Christmas Eve, she developed sudden onset of some left-sided suprapubic groin and low back discomfort. This was associated with fairly severe nausea and emesis. The pain markedly intensified and she presented to the emergency room actually on Christmas Day. Urinalysis at that time showed significant microhematuria without any evidence of infection. She did have some left-sided hydronephrosis. There was a 3 mm nonobstructing stone in the left mid pole. There was also what was felt to be a 5 mm distal left ureteral stone. I have reviewed her images and concur with these findings. Her stone appears to be about 4 mm in width and is about 5 to 6 mm in length. She has continued to have some lingering discomfort along with some nausea, but has not had any severe pain at this time. She was not started on an alpha blocker. She does have some irritative voiding symptoms consistent with a distal stone.     Jan 2017 Interval Hx:   Today denies passing any stone material and continues to have LLQ pain, frequency/urgency. She is not taking large amount of pain medication due to feeling depressed on meds. Remains on Rapaflo daily. Denies hematuria, n/v, or f/c.   Vitals Vital Signs [Data Includes: Last 1 Day]  Recorded: 04Jan2017 10:02AM  Height: 5 ft 5 in Weight: 150 lb  BMI Calculated: 24.96 BSA Calculated: 1.75 Blood Pressure: 137 / 84 Temperature: 98.2 F Heart Rate: 57  Physical Exam Constitutional: Well nourished and well  developed . No acute distress. The patient appears well hydrated.  Abdomen: The abdomen is flat. The abdomen is soft and nontender. No tenderness in the RLQ and no LLQ tenderness. No CVA tenderness.    Results/Data Urine [Data Includes: Last 1 Day]   04Jan2017  COLOR YELLOW   APPEARANCE CLEAR   SPECIFIC GRAVITY 1.010   pH 5.0   GLUCOSE NEGATIVE   BILIRUBIN NEGATIVE   KETONE NEGATIVE   BLOOD 1+   PROTEIN NEGATIVE   NITRITE NEGATIVE   LEUKOCYTE ESTERASE 1+   SQUAMOUS EPITHELIAL/HPF 0-5 HPF  WBC 0-5 WBC/HPF  RBC 0-2 RBC/HPF  BACTERIA NONE SEEN HPF  CRYSTALS NONE SEEN HPF  CASTS NONE SEEN LPF  Yeast NONE SEEN HPF   The following images/tracing/specimen were independently visualized:  KUB: Shows large amount of gas/stool throughout abdomen but distal left ureteral calculus noted near UVJ.  The following clinical lab reports were reviewed:  UA: negative except dipstick blood.    Assessment Assessed  1. Calculus of distal left ureter (N20.1)  Plan Calculus of distal left ureter  1. Start: Rapaflo 8 MG Oral Capsule; TAKE 1 CAPSULE Daily 2. Start: TraMADol HCl - 50 MG Oral Tablet; TAKE 1 TABLET Every 6 hours PRN  Tramadol 50 mg 1 po Q6 hrs prn  Tamsulosin 0.4 mg 1 po daily   Continue straining urine.   Instructed to resolve constipation with LOC  She is now ready to proceed with cystourethroscopy, (L) RPG, stone extraction, and possible double J  stent placement. Risks and benefits discussed with her at last visit with Dr. Isabel CapriceGrapey. She understands if she passes stone to notify office and if she has any acute changes will also notify office and may need urgent procedure with on-call MD   Signatures Electronically signed by : Jetta Loutiane Warden, Dyann RuddleANP-C; Apr 27 2015 10:25AM EST

## 2015-05-04 NOTE — Discharge Instructions (Addendum)

## 2015-05-05 ENCOUNTER — Encounter (HOSPITAL_BASED_OUTPATIENT_CLINIC_OR_DEPARTMENT_OTHER): Payer: Self-pay | Admitting: Urology

## 2017-01-21 ENCOUNTER — Other Ambulatory Visit: Payer: Self-pay | Admitting: Family Medicine

## 2017-01-21 ENCOUNTER — Other Ambulatory Visit (HOSPITAL_COMMUNITY)
Admission: RE | Admit: 2017-01-21 | Discharge: 2017-01-21 | Disposition: A | Discharge status: Home / Self Care | Payer: Managed Care, Other (non HMO) | Source: Ambulatory Visit | Attending: Family Medicine | Admitting: Family Medicine

## 2017-01-21 DIAGNOSIS — Z0000 Encounter for general adult medical examination without abnormal findings: Secondary | ICD-10-CM | POA: Insufficient documentation

## 2017-01-22 LAB — CYTOLOGY - PAP
DIAGNOSIS: NEGATIVE
HPV (WINDOPATH): DETECTED — AB

## 2017-01-28 ENCOUNTER — Other Ambulatory Visit: Payer: Self-pay | Admitting: Family Medicine

## 2017-01-28 DIAGNOSIS — Z1231 Encounter for screening mammogram for malignant neoplasm of breast: Secondary | ICD-10-CM

## 2017-04-28 IMAGING — CT CT ABD-PELV W/O CM
2 of 4 series · 16 of 46 positions shown, 18 images · non-contrast
Comparison: June 29, 2008

CLINICAL DATA: Left flank pain since yesterday

EXAM:
CT ABDOMEN AND PELVIS WITHOUT CONTRAST
TECHNIQUE: Multidetector CT imaging of the abdomen and pelvis was performed
following the standard protocol without IV contrast.

[Series 2: renal stone 5mm · axial · 0.76mm/px · z∈[+834,+1258]mm · 13 of 93 slices shown, 15 images]
[im 4/93  soft-tissue]
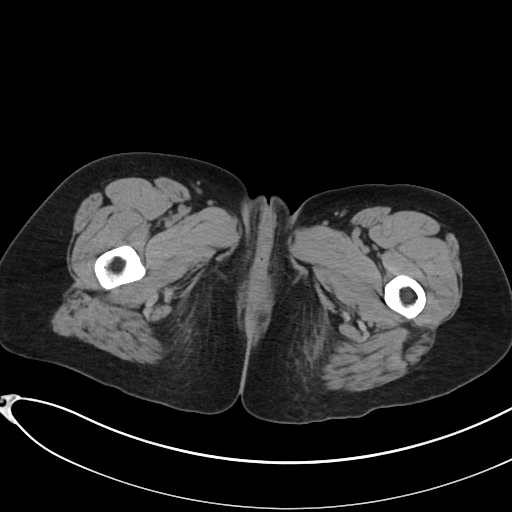
[im 4/93  bone]
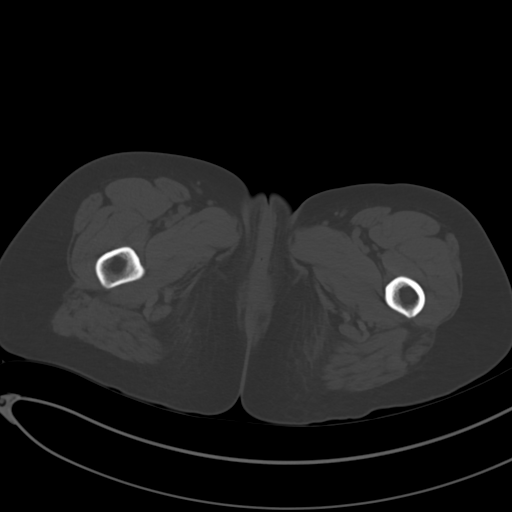
[im 12/93  soft-tissue]
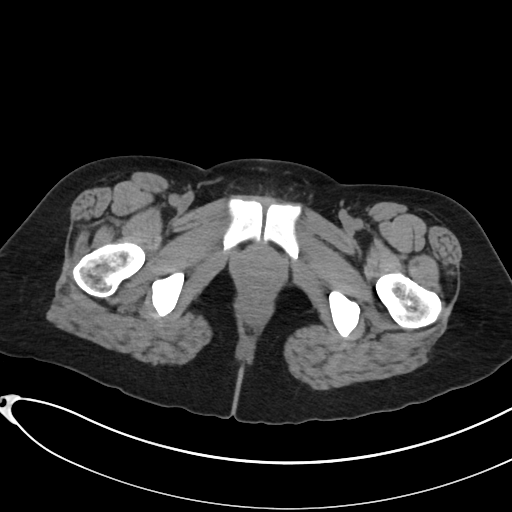
[im 19/93  soft-tissue]
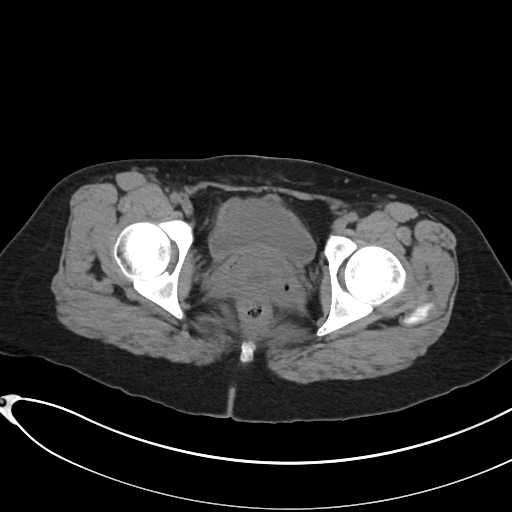
[im 26/93  soft-tissue]
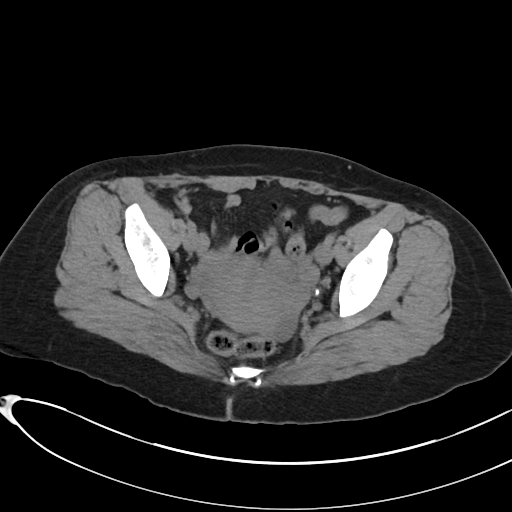
[im 34/93  soft-tissue]
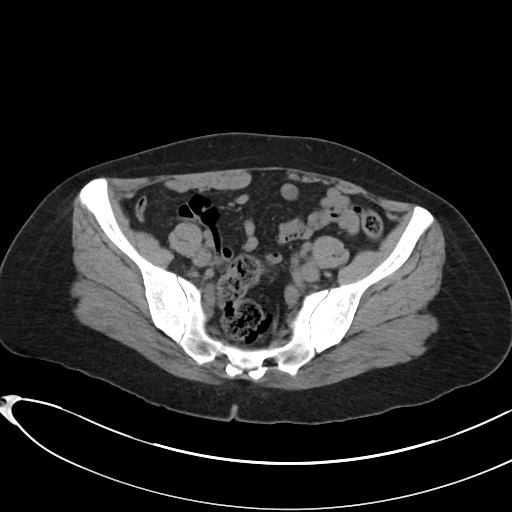
[im 41/93  soft-tissue]
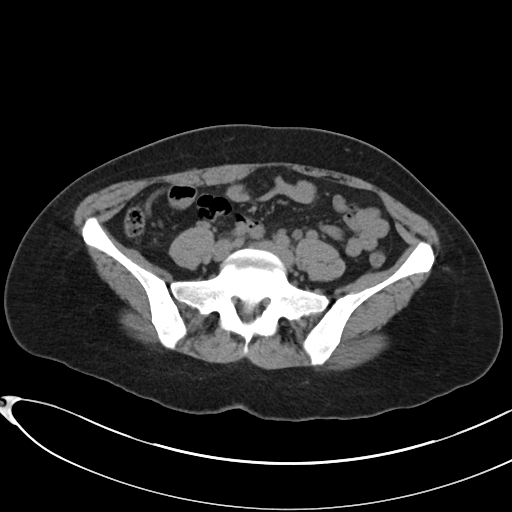
[im 48/93  soft-tissue]
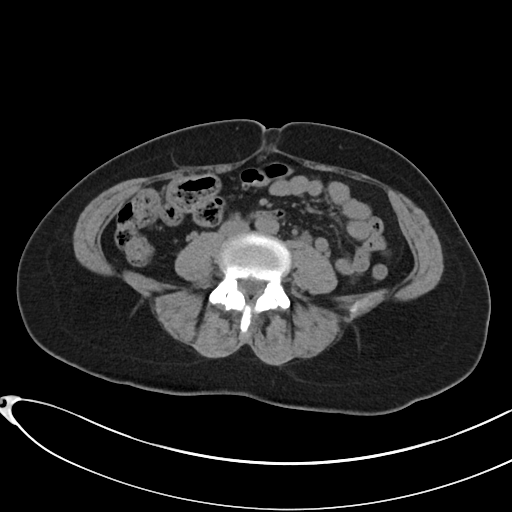
[im 52/93  soft-tissue]
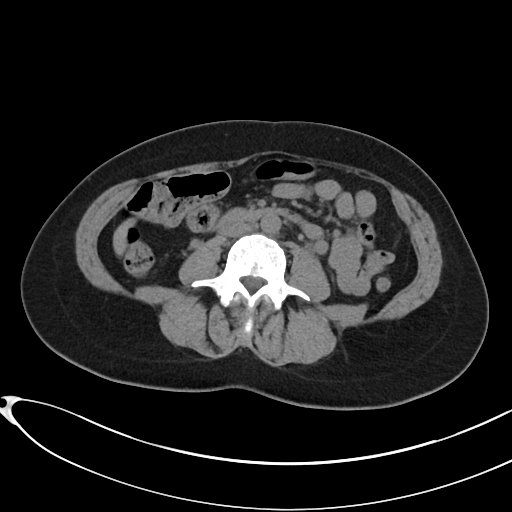
[im 59/93  soft-tissue]
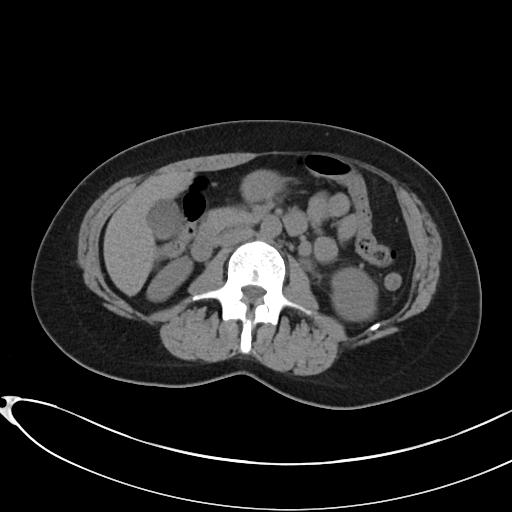
[im 59/93  bone]
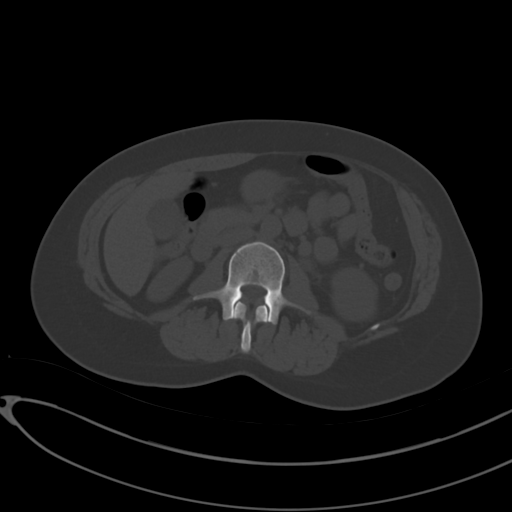
[im 67/93  soft-tissue]
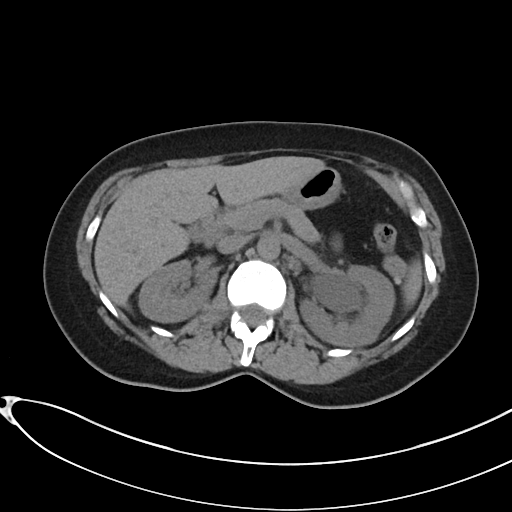
[im 74/93  soft-tissue]
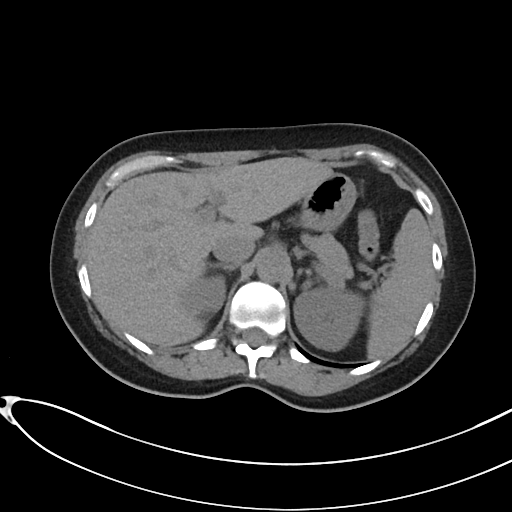
[im 81/93  soft-tissue]
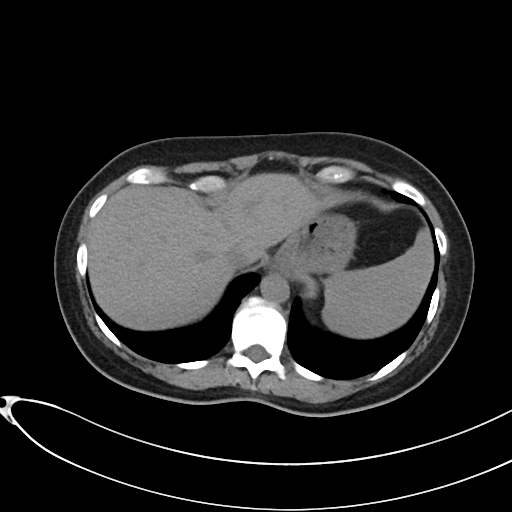
[im 89/93  soft-tissue]
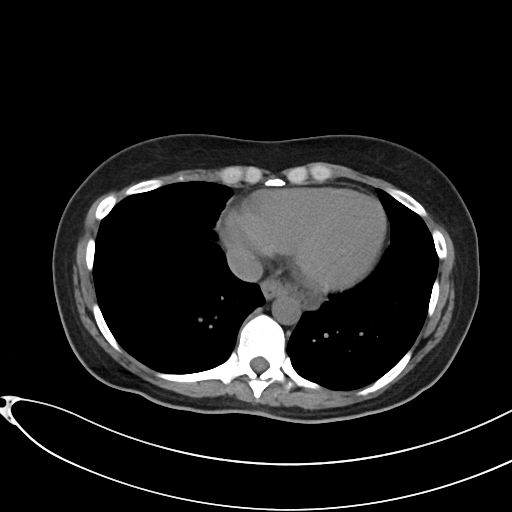

[Series 4: renal stone 3.0 cor · coronal · 0.75mm/px · 3 of 85 slices shown]
[im 29/85  soft-tissue]
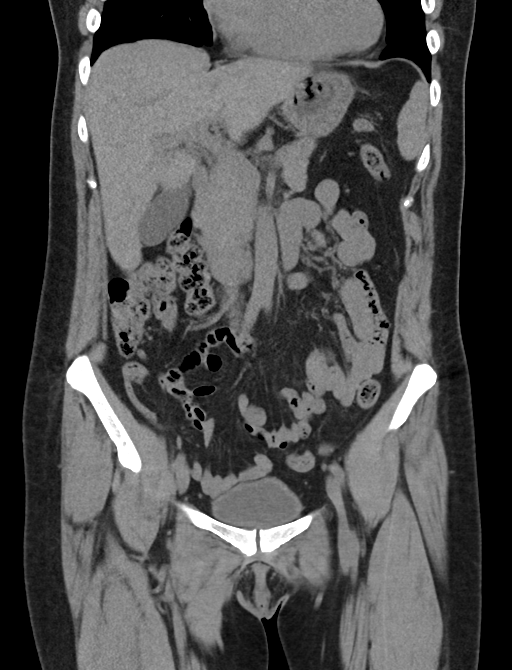
[im 38/85  soft-tissue]
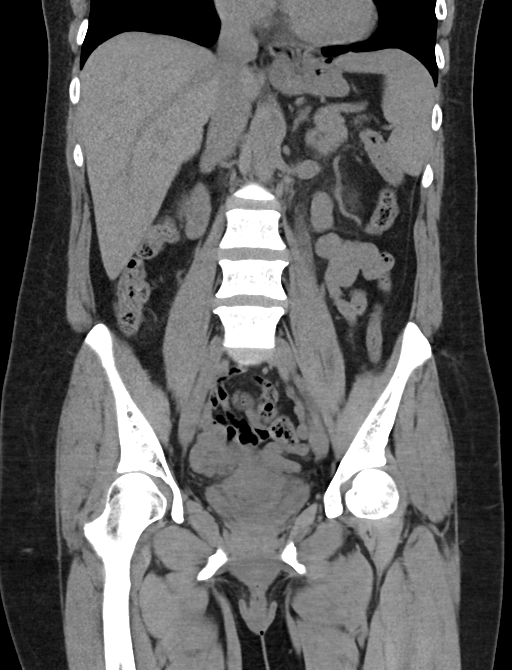
[im 47/85  soft-tissue]
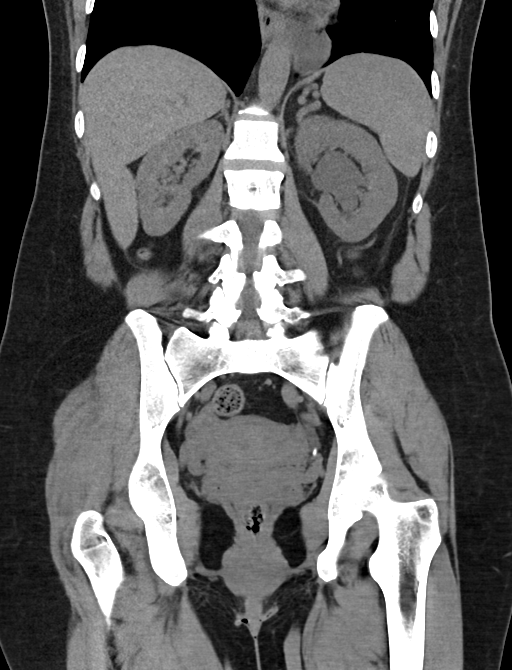

[16 of 46 positions shown; findings below may reference images not displayed]

FINDINGS: There is left hydroureteronephrosis due to obstruction by a 5.4 mm
stone in the distal left ureter. There is a 3 mm nonobstructing
stone in midpole left kidney. The right kidney is normal without
hydronephrosis or nephrolithiasis.

The liver, spleen, pancreas, gallbladder, adrenal glands are normal.
The aorta is normal. There is no abdominal lymphadenopathy. There is
no small bowel obstruction or diverticulitis. The appendix is
normal.

The bladder is normal. The uterus is normal. There are small cysts
in normal size bilateral ovaries. The lung bases are clear. No acute
abnormality is identified in the bones.
IMPRESSION: Left hydroureteronephrosis due to obstruction by 5.4 mm stone in the
distal left ureter.

## 2018-05-05 ENCOUNTER — Encounter: Payer: Self-pay | Admitting: Physician Assistant

## 2018-05-05 NOTE — Progress Notes (Deleted)
MRN: [id number] DOB: [birth year]  Subjective:   Nicole Lawson is a 48 y.o. female presenting for chief complaint of No chief complaint on file. .  Reports *** history of {URI Symptoms:22220}, {Systemic Symptoms:[phone]}. Has tried *** relief. Denies {URI Symptoms:22220}, {Systemic Symptoms:[phone]}. Has *** had *** sick contact with ***. *** history of seasonal allergies, history of asthma. Patient *** flu shot this season. *** smoking, *** alcohol. Denies any other aggravating or relieving factors, no other questions or concerns.  ROS  Nicole Lawson has a current medication list which includes the following prescription(s): hydrocodone-acetaminophen, hydrocodone-acetaminophen, ondansetron, and senna. Also is allergic to penicillins; vancomycin; bactrim [sulfamethoxazole-trimethoprim]; keflex [cephalexin]; reglan [metoclopramide]; and sulfa antibiotics.  Nicole Lawson  has a past medical history of Dysuria, Frequency of urination, GERD (gastroesophageal reflux disease), History of hiatal hernia, History of kidney stones, History of urethral stricture, Left ureteral stone, PONV (postoperative nausea and vomiting), and Urgency of urination. Also  has a past surgical history that includes Cesarean section (1995  &  12-05-2004); Inguinal hernia repair (Right, 1997); Cystoscopy with urethral dilatation (3 months old,  age 42,  age 79); Nissen fundoplication (1987); Holmium laser application (Left, 05/04/2015); Cystoscopy w/ retrogrades (Left, 05/04/2015); Cystoscopy/ureteroscopy/holmium laser (Left, 05/04/2015); and Stone extraction with basket (Left, 05/04/2015).   Objective:   Vitals: There were no vitals taken for this visit.  Physical Exam  No results found for this or any previous visit (from the past 24 hour(s)).  Assessment and Plan :  There are no diagnoses linked to this encounter.   Benjiman Core, Cordelia Poche  [facility] 05/05/2018 8:35 AM

## 2018-05-08 ENCOUNTER — Other Ambulatory Visit: Payer: Self-pay

## 2018-05-08 ENCOUNTER — Emergency Department (HOSPITAL_COMMUNITY): Payer: Self-pay

## 2018-05-08 ENCOUNTER — Emergency Department (HOSPITAL_COMMUNITY)
Admission: EM | Admit: 2018-05-08 | Discharge: 2018-05-08 | Disposition: A | Discharge status: Home / Self Care | Payer: Self-pay | Attending: Emergency Medicine | Admitting: Emergency Medicine

## 2018-05-08 ENCOUNTER — Encounter (HOSPITAL_COMMUNITY): Payer: Self-pay

## 2018-05-08 DIAGNOSIS — Y998 Other external cause status: Secondary | ICD-10-CM | POA: Insufficient documentation

## 2018-05-08 DIAGNOSIS — Y92410 Unspecified street and highway as the place of occurrence of the external cause: Secondary | ICD-10-CM | POA: Insufficient documentation

## 2018-05-08 DIAGNOSIS — Y9389 Activity, other specified: Secondary | ICD-10-CM | POA: Insufficient documentation

## 2018-05-08 DIAGNOSIS — M542 Cervicalgia: Secondary | ICD-10-CM | POA: Insufficient documentation

## 2018-05-08 DIAGNOSIS — R109 Unspecified abdominal pain: Secondary | ICD-10-CM | POA: Insufficient documentation

## 2018-05-08 DIAGNOSIS — F17210 Nicotine dependence, cigarettes, uncomplicated: Secondary | ICD-10-CM | POA: Insufficient documentation

## 2018-05-08 DIAGNOSIS — R0789 Other chest pain: Secondary | ICD-10-CM | POA: Insufficient documentation

## 2018-05-08 LAB — CBC WITH DIFFERENTIAL/PLATELET
Abs Immature Granulocytes: 0.05 10*3/uL (ref 0.00–0.07)
BASOS PCT: 0 %
Basophils Absolute: 0 10*3/uL (ref 0.0–0.1)
EOS ABS: 0 10*3/uL (ref 0.0–0.5)
Eosinophils Relative: 0 %
HEMATOCRIT: 40.1 % (ref 36.0–46.0)
Hemoglobin: 13.4 g/dL (ref 12.0–15.0)
Immature Granulocytes: 0 %
LYMPHS ABS: 1 10*3/uL (ref 0.7–4.0)
Lymphocytes Relative: 9 %
MCH: 29.9 pg (ref 26.0–34.0)
MCHC: 33.4 g/dL (ref 30.0–36.0)
MCV: 89.5 fL (ref 80.0–100.0)
MONO ABS: 0.3 10*3/uL (ref 0.1–1.0)
MONOS PCT: 3 %
Neutro Abs: 9.9 10*3/uL — ABNORMAL HIGH (ref 1.7–7.7)
Neutrophils Relative %: 88 %
PLATELETS: 239 10*3/uL (ref 150–400)
RBC: 4.48 MIL/uL (ref 3.87–5.11)
RDW: 12.6 % (ref 11.5–15.5)
WBC: 11.3 10*3/uL — ABNORMAL HIGH (ref 4.0–10.5)
nRBC: 0 % (ref 0.0–0.2)

## 2018-05-08 LAB — I-STAT TROPONIN, ED
TROPONIN I, POC: 0 ng/mL (ref 0.00–0.08)
Troponin i, poc: 0.01 ng/mL (ref 0.00–0.08)

## 2018-05-08 LAB — BASIC METABOLIC PANEL
Anion gap: 11 (ref 5–15)
BUN: 13 mg/dL (ref 6–20)
CHLORIDE: 109 mmol/L (ref 98–111)
CO2: 22 mmol/L (ref 22–32)
CREATININE: 0.82 mg/dL (ref 0.44–1.00)
Calcium: 9.4 mg/dL (ref 8.9–10.3)
GFR calc Af Amer: >60 mL/min (ref >60)
GFR calc non Af Amer: >60 mL/min (ref >60)
Glucose, Bld: 106 mg/dL — ABNORMAL HIGH (ref 70–99)
Potassium: 3.7 mmol/L (ref 3.5–5.1)
Sodium: 142 mmol/L (ref 135–145)

## 2018-05-08 LAB — I-STAT BETA HCG BLOOD, ED (MC, WL, AP ONLY): I-stat hCG, quantitative: <5 m[IU]/mL (ref <5)

## 2018-05-08 MED ORDER — IOHEXOL 300 MG/ML  SOLN
100.0000 mL | Freq: Once | INTRAMUSCULAR | Status: AC | PRN
  Administered 2018-05-08: 100 mL via INTRAVENOUS

## 2018-05-08 MED ORDER — SODIUM CHLORIDE 0.9 % IV BOLUS
500.0000 mL | Freq: Once | INTRAVENOUS | Status: AC
  Administered 2018-05-08: 500 mL via INTRAVENOUS

## 2018-05-08 MED ORDER — METHOCARBAMOL 500 MG PO TABS: 500.0000 mg | ORAL_TABLET | Freq: Two times a day (BID) | ORAL | 0 refills | Status: AC

## 2018-05-08 NOTE — ED Notes (Signed)
Pt back from CT

## 2018-05-08 NOTE — ED Notes (Signed)
Pt transported to Xray. 

## 2018-05-08 NOTE — ED Notes (Signed)
ED Provider at bedside. 
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
## 2018-05-08 NOTE — ED Notes (Signed)
Patient verbalizes understanding of discharge instructions. Opportunity for questioning and answers were provided. Armband removed by staff, pt discharged from ED ambulatory w/ husband  

## 2018-05-08 NOTE — ED Notes (Signed)
Patient transported to CT 
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
## 2018-05-08 NOTE — ED Notes (Signed)
Pt back from X-ray.  
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
## 2018-05-12 NOTE — Progress Notes (Signed)
This encounter was created in error - please disregard.

## 2018-07-30 ENCOUNTER — Other Ambulatory Visit: Payer: Self-pay | Admitting: Family Medicine

## 2018-07-30 DIAGNOSIS — R1011 Right upper quadrant pain: Secondary | ICD-10-CM

## 2018-07-31 ENCOUNTER — Other Ambulatory Visit: Payer: Self-pay

## 2018-07-31 ENCOUNTER — Ambulatory Visit
Admission: RE | Admit: 2018-07-31 | Discharge: 2018-07-31 | Disposition: A | Discharge status: Home / Self Care | Payer: Managed Care, Other (non HMO) | Source: Ambulatory Visit | Attending: Family Medicine | Admitting: Family Medicine

## 2018-07-31 DIAGNOSIS — R1011 Right upper quadrant pain: Secondary | ICD-10-CM

## 2019-04-20 ENCOUNTER — Other Ambulatory Visit: Payer: Self-pay | Admitting: Nurse Practitioner

## 2019-04-20 DIAGNOSIS — Z1231 Encounter for screening mammogram for malignant neoplasm of breast: Secondary | ICD-10-CM

## 2020-12-18 LAB — COLOGUARD: COLOGUARD: NEGATIVE
